# Patient Record
Sex: Male | Born: 2000 | Hispanic: Yes | Marital: Single | Smoking: Never smoker
Health system: Southern US, Community
[De-identification: ages and names within clinical notes are randomized; demographics above are authoritative.]

## PROBLEM LIST (undated history)

## (undated) DIAGNOSIS — K37 Unspecified appendicitis: Secondary | ICD-10-CM

## (undated) HISTORY — DX: Unspecified appendicitis: K37

---
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
## 2021-05-12 ENCOUNTER — Other Ambulatory Visit: Payer: Self-pay

## 2021-05-12 MED ORDER — TRIAMCINOLONE ACETONIDE 0.1 % EX OINT: TOPICAL_OINTMENT | CUTANEOUS | 0 refills | Status: DC

## 2021-06-03 ENCOUNTER — Other Ambulatory Visit: Payer: Self-pay

## 2021-06-03 ENCOUNTER — Ambulatory Visit (INDEPENDENT_AMBULATORY_CARE_PROVIDER_SITE_OTHER): Payer: Medicaid Other | Admitting: Dermatology

## 2021-06-03 DIAGNOSIS — L309 Dermatitis, unspecified: Secondary | ICD-10-CM

## 2021-06-03 MED ORDER — OPZELURA 1.5 % EX CREA: 1.0000 "application " | TOPICAL_CREAM | Freq: Two times a day (BID) | CUTANEOUS | 2 refills | Status: AC

## 2021-06-03 MED ORDER — CLOBETASOL PROPIONATE 0.05 % EX OINT: TOPICAL_OINTMENT | CUTANEOUS | 1 refills | Status: AC

## 2021-06-03 NOTE — Patient Instructions (Addendum)
Hand Dermatitis is a chronic type of eczema that can come and go on the hands and fingers.  While there is no cure, the rash and symptoms can be managed with topical prescription medications, and for more severe cases, with systemic medications.  Recommend mild soap and routine use of moisturizing cream after handwashing.  Minimize soap/water exposure when possible.    Start Opzelura Cream Apply to rash on hands 1-2 times a day until improved. Start Clobetasol Oinement Apply to more severe areas rash on hands 1-2 times a day until improved. Avoid face, groin, underarms.  Avoid using [employer]. Recommend Dove soap with handwashing.  Topical steroids (such as triamcinolone, fluocinolone, fluocinonide, mometasone, clobetasol, halobetasol, betamethasone, hydrocortisone) can cause thinning and lightening of the skin if they are used for too long in the same area. Your physician has selected the right strength medicine for your problem and area affected on the body. Please use your medication only as directed by your physician to prevent side effects.   If You Need Anything After Your Visit  If you have any questions or concerns for your doctor, please call our main line at [phone] and press option 4 to reach your doctor's medical assistant. If no one answers, please leave a voicemail as directed and we will return your call as soon as possible. Messages left after 4 pm will be answered the following business day.   You may also send [place] a message via MyChart. We typically respond to MyChart messages within 1-2 business days.  For prescription refills, please ask your pharmacy to contact our office. Our fax number is [phone].  If you have an urgent issue when the clinic is closed that cannot wait until the next business day, you can page your doctor at the number below.    Please note that while we do our best to be available for urgent issues outside of office hours, we are not  available 24/7.   If you have an urgent issue and are unable to reach [place], you may choose to seek medical care at your doctor's office, retail clinic, urgent care center, or emergency room.  If you have a medical emergency, please immediately call 911 or go to the emergency department.  Pager Numbers  - Dr. Gwen Pounds: [phone]  - Dr. Neale Burly: [phone]  - Dr. Roseanne Reno: [phone]  In the event of inclement weather, please call our main line at [phone] for an update on the status of any delays or closures.  Dermatology Medication Tips: Please keep the boxes that topical medications come in in order to help keep track of the instructions about where and how to use these. Pharmacies typically print the medication instructions only on the boxes and not directly on the medication tubes.   If your medication is too expensive, please contact our office at [phone] option 4 or send [place] a message through MyChart.   We are unable to tell what your co-pay for medications will be in advance as this is different depending on your insurance coverage. However, we may be able to find a substitute medication at lower cost or fill out paperwork to get insurance to cover a needed medication.   If a prior authorization is required to get your medication covered by your insurance company, please allow [place] 1-2 business days to complete this process.  Drug prices often vary depending on where the prescription is filled and some pharmacies may offer cheaper prices.  The website www.goodrx.com  contains coupons for medications through different pharmacies. The prices here do not account for what the cost may be with help from insurance (it may be cheaper with your insurance), but the website can give you the price if you did not use any insurance.  - You can print the associated coupon and take it with your prescription to the pharmacy.  - You may also stop by our office during regular business hours  and pick up a GoodRx coupon card.  - If you need your prescription sent electronically to a different pharmacy, notify our office through [facility] or by phone at [phone] option 4.     Si Usted Necesita Algo Despus de Su Visita  Tambin puede enviarnos un mensaje a travs de Clinical cytogeneticist. Por lo general respondemos a los mensajes de MyChart en el transcurso de 1 a 2 das hbiles.  Para renovar recetas, por favor pida a su farmacia que se ponga en contacto con nuestra oficina. Annie Sable de fax es Benjamin Perez [phone].  Si tiene un asunto urgente cuando la clnica est cerrada y que no puede esperar hasta el siguiente da hbil, puede llamar/localizar a su doctor(a) al nmero que aparece a continuacin.   Por favor, tenga en cuenta que aunque hacemos todo lo posible para estar disponibles para asuntos urgentes fuera del horario de [place], no estamos disponibles las 24 horas del da, los 7 [address]  [address] de la [place].   Si tiene un problema urgente y no puede comunicarse con nosotros, puede optar por buscar atencin mdica  en el consultorio de su doctor(a), en una clnica privada, en un centro de atencin urgente o en una sala de emergencias.  Si tiene Engineer, drilling, por favor llame inmediatamente al 911 o vaya a la sala de emergencias.  Nmeros de bper  - Dr. Gwen Pounds: [phone]  - Dra. Moye: [phone]  - Dra. Roseanne Reno: [phone]  En caso de inclemencias del [place], por favor llame a Lacy Duverney principal al [phone] para una actualizacin sobre el [place] de cualquier retraso o cierre.  Consejos para la medicacin en dermatologa: Por favor, guarde las cajas en las que vienen los medicamentos de uso tpico para ayudarle a seguir las instrucciones sobre dnde y cmo usarlos. Las farmacias generalmente imprimen las instrucciones del medicamento slo en las cajas y no directamente en los tubos del [place].   Si su medicamento es muy caro, por favor, pngase  en contacto con Rolm Gala llamando al [phone] y presione la opcin 4 o envenos un mensaje a travs de Clinical cytogeneticist.   No podemos decirle cul ser su copago por los medicamentos por adelantado ya que esto es diferente dependiendo de la cobertura de su seguro. Sin embargo, es posible que podamos encontrar un medicamento sustituto a Audiological scientist un formulario para que el seguro cubra el medicamento que se considera necesario.   Si se requiere una autorizacin previa para que su compaa de seguros [place] su medicamento, por favor permtanos de 1 a 2 das hbiles para completar [address]  Los precios de los medicamentos varan con frecuencia dependiendo del Environmental consultant de dnde se surte la receta y alguna farmacias pueden ofrecer precios ms baratos.  El sitio web www.goodrx.com tiene cupones para medicamentos de Health and safety inspector. Los precios aqu no tienen en cuenta lo que podra costar con la ayuda del seguro (puede ser ms barato con su seguro), pero el sitio web puede darle el precio si no utiliz Tourist information centre manager.  - Puede imprimir el  cupn correspondiente y llevarlo con su receta a la farmacia.  - Tambin puede pasar por nuestra oficina durante el horario de atencin regular y Charity fundraiser una tarjeta de cupones de GoodRx.  - Si necesita que su receta se enve electrnicamente a una farmacia diferente, informe a nuestra oficina a travs de MyChart de [place] o por telfono llamando al [phone] y presione la opcin 4.

## 2021-06-03 NOTE — Progress Notes (Signed)
° °  Follow-Up Visit   Subjective  Chad Warren is a 21 y.o. male who presents for the following: Dermatitis (Hands, off and on for years. Patient is currently using TMC 0.1% ointment every night, but not working as well as it has in the past. Uses CeraVe Cream to moisturize. ). He uses [employer] soap to wash hands.    The following portions of the chart were reviewed this encounter and updated as appropriate:       Review of Systems:  No other skin or systemic complaints except as noted in HPI or Assessment and Plan.  Objective  Well appearing patient in no apparent distress; mood and affect are within normal limits.  A focused examination was performed including hands. Relevant physical exam findings are noted in the Assessment and Plan.  hands hyperpigmented scaly patches of the hands with erythema.     Assessment & Plan  Hand dermatitis hands  With flare  Hand Dermatitis is a chronic type of eczema that can come and go on the hands and fingers.  While there is no cure, the rash and symptoms can be managed with topical prescription medications, and for more severe cases, with systemic medications.  Recommend mild soap and routine use of moisturizing cream after handwashing.  Minimize soap/water exposure when possible.    Start Opzelura Cream Apply to hands qd/bid prn rash dsp 60g 2Rf If not covered, will switch to [place] or tacrolimus ointment.  Start Clobetasol Ointment Apply to more severe areas of hands qd/bid dsp 30g 1Rf. Avoid face, groin, axilla. Caution atrophy with long-term use.   Avoid [employer] Works soap and use Futures trader for handwashing. And CeraVe cream as moisturizer after handwashing  Ruxolitinib Phosphate (OPZELURA) 1.5 % CREA - hands Apply 1 application topically in the morning and at bedtime. Apply to hands as needed for rash.  clobetasol ointment (TEMOVATE) 0.05 % - hands Apply to more severe areas rash on hands 1-2 times a day until improved.  Avoid face, groin, axilla.   Return if symptoms worsen or fail to improve.  ICherlyn Labella, CMA, am acting as scribe for Willeen Niece, MD .  Documentation: I have reviewed the above documentation for accuracy and completeness, and I agree with the above.  Willeen Niece MD

## 2022-08-24 ENCOUNTER — Ambulatory Visit: Payer: Medicaid Other | Attending: Cardiology | Admitting: Cardiology

## 2022-08-24 ENCOUNTER — Encounter: Payer: Self-pay | Admitting: Cardiology

## 2022-08-27 ENCOUNTER — Encounter: Payer: Self-pay | Admitting: *Deleted

## 2022-11-15 ENCOUNTER — Encounter: Payer: Self-pay | Admitting: Emergency Medicine

## 2022-11-15 ENCOUNTER — Other Ambulatory Visit: Payer: Self-pay

## 2022-11-15 DIAGNOSIS — H18822 Corneal disorder due to contact lens, left eye: Secondary | ICD-10-CM | POA: Diagnosis not present

## 2022-11-15 DIAGNOSIS — H5789 Other specified disorders of eye and adnexa: Secondary | ICD-10-CM | POA: Diagnosis present

## 2022-11-15 NOTE — ED Notes (Signed)
Visual Acuity with glasses on   Both - 20/25 R eye- 20/25 L eye- 20/25

## 2022-11-15 NOTE — ED Triage Notes (Signed)
Pt presents ambulatory to triage via POV with complaints of L eye pain started today. Pt notes having some redness earlier to his eye - no injury. The pain was made worse when removing his contacts; wearing glasses at this time. A&Ox4 at this time. Denies dizziness, CP or SOB.

## 2022-11-16 ENCOUNTER — Emergency Department
Admission: EM | Admit: 2022-11-16 | Discharge: 2022-11-16 | Disposition: A | Discharge status: Home / Self Care | Payer: Medicaid Other | Attending: Emergency Medicine | Admitting: Emergency Medicine

## 2022-11-16 DIAGNOSIS — H18822 Corneal disorder due to contact lens, left eye: Secondary | ICD-10-CM

## 2022-11-16 MED ORDER — TOBRAMYCIN 0.3 % OP OINT
1.0000 | TOPICAL_OINTMENT | Freq: Once | OPHTHALMIC | Status: DC
  Filled 2022-11-16: qty 3.5

## 2022-11-16 MED ORDER — TOBRAMYCIN 0.3 % OP SOLN
1.0000 [drp] | Freq: Once | OPHTHALMIC | Status: AC
  Administered 2022-11-16: 1 [drp] via OPHTHALMIC
  Filled 2022-11-16: qty 5

## 2022-11-16 MED ORDER — FLUORESCEIN SODIUM 1 MG OP STRP
1.0000 | ORAL_STRIP | Freq: Once | OPHTHALMIC | Status: AC
  Administered 2022-11-16: 1 via OPHTHALMIC
  Filled 2022-11-16: qty 1

## 2022-11-16 MED ORDER — TOBRAMYCIN 0.3 % OP OINT: TOPICAL_OINTMENT | Freq: Four times a day (QID) | OPHTHALMIC | 0 refills | Status: AC

## 2022-11-16 MED ORDER — TETRACAINE HCL 0.5 % OP SOLN
1.0000 [drp] | Freq: Once | OPHTHALMIC | Status: AC
  Administered 2022-11-16: 1 [drp] via OPHTHALMIC
  Filled 2022-11-16: qty 4

## 2022-11-16 NOTE — Discharge Instructions (Addendum)
Use the tobramycin antibiotic ointment on your left eye, every 6 hours, for at least the next 4 or 5 days  Do not wear your contact lenses while using this ointment and while this is healing  See your eye doctor later this week, I have also attached the information for one of our ophthalmologist that can see you in the clinic if needed this week

## 2022-11-16 NOTE — ED Provider Notes (Signed)
[facility] Provider Note    Event Date/Time   First MD Initiated Contact with Patient 11/16/22 [phone]     (approximate)   History   Eye Pain   HPI  Chad Warren is a 22 y.o. male who presents to the ED for evaluation of Eye Pain   Patient presents to the ED with left eye irritation, redness and foreign body sensation.  Patient wears contact lenses and reports he developed pain a couple days ago after he tried to take out his contact lenses.  He was able to take it out and has not worn any contact lenses to this eye since, but reports persistent redness, photophobia and foreign body sensation.  Reports his visual acuity is typical for when he is not wearing his corrective lenses.  No fevers or discharge   Physical Exam   Triage Vital Signs: ED Triage Vitals [11/15/22 2311]  Enc Vitals Group     BP 125/84     Pulse Rate 90     Resp 18     Temp 98.4 F (36.9 C)     Temp src      SpO2 100 %     Weight 210 lb (95.3 kg)     Height 5\' 8"  (1.727 m)     Head Circumference      Peak Flow      Pain Score 6     Pain Loc      Pain Edu?      Excl. in GC?     Most recent vital signs: Vitals:   11/15/22 2311  BP: 125/84  Pulse: 90  Resp: 18  Temp: 98.4 F (36.9 C)  SpO2: 100%    General: Awake, no distress.  CV:  Good peripheral perfusion.  Resp:  Normal effort.  Abd:  No distention.  MSK:  No deformity noted.  Neuro:  No focal deficits appreciated. Other:  Mild conjunctival erythema on the left side, no proptosis.  Pupils are PERRL fluorescein stain with various areas of uptake without Seidel sign, consistent with corneal abrasion   ED Results / Procedures / Treatments   Labs (all labs ordered are listed, but only abnormal results are displayed) Labs Reviewed - No data to display  EKG   RADIOLOGY   Official radiology report(s): No results found.  PROCEDURES and INTERVENTIONS:  Procedures  Medications  tobramycin (TOBREX) 0.3 %  ophthalmic ointment 1 Application (has no administration in time range)  fluorescein ophthalmic strip 1 strip (1 strip Left Eye Given by Other 11/16/22 0130)  tetracaine (PONTOCAINE) 0.5 % ophthalmic solution 1 drop (1 drop Left Eye Given by Other 11/16/22 0130)     IMPRESSION / MDM / ASSESSMENT AND PLAN / ED COURSE  I reviewed the triage vital signs and the nursing notes.  Differential diagnosis includes, but is not limited to, corneal abrasion, ulceration, retained contact or other foreign body  Patient presents with evidence of corneal abrasion associate with contact lenses.  We will get him started on appropriate antibiotic drops and have him follow-up with ophthalmology.  Discussed return precautions.     FINAL CLINICAL IMPRESSION(S) / ED DIAGNOSES   Final diagnoses:  Corneal abrasion of left eye due to contact lens     Rx / DC Orders   ED Discharge Orders          Ordered    tobramycin (TOBREX) 0.3 % ophthalmic ointment  Every 6 hours  11/16/22 0132             Note:  This document was prepared using Dragon voice recognition software and may include unintentional dictation errors.   Delton Pra[place], MD 11/16/22 [phone]

## 2023-01-19 ENCOUNTER — Ambulatory Visit: Payer: Medicaid Other

## 2023-02-21 ENCOUNTER — Ambulatory Visit: Payer: Medicaid Other

## 2023-02-21 DIAGNOSIS — Z23 Encounter for immunization: Secondary | ICD-10-CM

## 2023-02-21 DIAGNOSIS — Z719 Counseling, unspecified: Secondary | ICD-10-CM

## 2023-02-21 NOTE — Progress Notes (Signed)
In nurse clinic for immunization today Tdap. Declines Flu/Covid today. VIS provided. Tolerated vaccines well. Copy given of [facility].

## 2023-02-22 ENCOUNTER — Encounter: Payer: Self-pay | Admitting: Dermatology

## 2023-02-22 ENCOUNTER — Ambulatory Visit: Payer: Medicaid Other | Admitting: Dermatology

## 2023-02-22 DIAGNOSIS — L700 Acne vulgaris: Secondary | ICD-10-CM | POA: Diagnosis not present

## 2023-02-22 DIAGNOSIS — L810 Postinflammatory hyperpigmentation: Secondary | ICD-10-CM | POA: Diagnosis not present

## 2023-02-22 DIAGNOSIS — L309 Dermatitis, unspecified: Secondary | ICD-10-CM | POA: Diagnosis not present

## 2023-02-22 MED ORDER — DOXYCYCLINE HYCLATE 100 MG PO TABS: ORAL_TABLET | ORAL | 2 refills | Status: DC

## 2023-02-22 MED ORDER — TRETINOIN 0.025 % EX CREA
TOPICAL_CREAM | CUTANEOUS | 6 refills | Status: DC
Start: 2023-02-22 — End: 2023-06-06

## 2023-02-22 MED ORDER — TRIAMCINOLONE ACETONIDE 0.1 % EX OINT: TOPICAL_OINTMENT | CUTANEOUS | 11 refills | Status: AC

## 2023-02-22 NOTE — Patient Instructions (Addendum)
Recommend chlorhexidine (hibiclens) wash for back daily in shower . Do not get chlorhexidine (hibiclens) wash in the eyes or ears. Women should not get the wash in the vaginal area.    Recommend OTC benzoyl peroxide cleanser, wash affected areas daily in shower, let sit several minutes prior to rinsing.  May bleach towels if not rinsed off completely.  Recommended brands include Panoxyl 4% Creamy Wash, CeraVe Acne Foaming Cream wash, or Cetaphil Gentle Clear Complexion-Clearing BPO Acne Cleanser.     Due to recent changes in healthcare laws, you may see results of your pathology and/or laboratory studies on MyChart before the doctors have had a chance to review them. We understand that in some cases there may be results that are confusing or concerning to you. Please understand that not all results are received at the same time and often the doctors may need to interpret multiple results in order to provide you with the best plan of care or course of treatment. Therefore, we ask that you please give [place] 2 business days to thoroughly review all your results before contacting the office for clarification. Should we see a critical lab result, you will be contacted sooner.   If You Need Anything After Your Visit  If you have any questions or concerns for your doctor, please call our main line at [phone] and press option 4 to reach your doctor's medical assistant. If no one answers, please leave a voicemail as directed and we will return your call as soon as possible. Messages left after 4 pm will be answered the following business day.   You may also send [place] a message via MyChart. We typically respond to MyChart messages within 1-2 business days.  For prescription refills, please ask your pharmacy to contact our office. Our fax number is [phone].  If you have an urgent issue when the clinic is closed that cannot wait until the next business day, you can page your doctor at the number below.     Please note that while we do our best to be available for urgent issues outside of office hours, we are not available 24/7.   If you have an urgent issue and are unable to reach [place], you may choose to seek medical care at your doctor's office, retail clinic, urgent care center, or emergency room.  If you have a medical emergency, please immediately call 911 or go to the emergency department.  Pager Numbers  - Dr. Gwen Pounds: [phone]  - Dr. Roseanne Reno: [phone]  - Dr. Katrinka Blazing: [phone]   In the event of inclement weather, please call our main line at [phone] for an update on the status of any delays or closures.  Dermatology Medication Tips: Please keep the boxes that topical medications come in in order to help keep track of the instructions about where and how to use these. Pharmacies typically print the medication instructions only on the boxes and not directly on the medication tubes.   If your medication is too expensive, please contact our office at [phone] option 4 or send [place] a message through MyChart.   We are unable to tell what your co-pay for medications will be in advance as this is different depending on your insurance coverage. However, we may be able to find a substitute medication at lower cost or fill out paperwork to get insurance to cover a needed medication.   If a prior authorization is required to get your medication covered by your insurance company, please allow [place]  1-2 business days to complete this process.  Drug prices often vary depending on where the prescription is filled and some pharmacies may offer cheaper prices.  The website www.goodrx.com contains coupons for medications through different pharmacies. The prices here do not account for what the cost may be with help from insurance (it may be cheaper with your insurance), but the website can give you the price if you did not use any insurance.  - You can print the associated coupon and  take it with your prescription to the pharmacy.  - You may also stop by our office during regular business hours and pick up a GoodRx coupon card.  - If you need your prescription sent electronically to a different pharmacy, notify our office through [facility] or by phone at [phone] option 4.     Si Usted Necesita Algo Despus de Su Visita  Tambin puede enviarnos un mensaje a travs de Clinical cytogeneticist. Por lo general respondemos a los mensajes de MyChart en el transcurso de 1 a 2 das hbiles.  Para renovar recetas, por favor pida a su farmacia que se ponga en contacto con nuestra oficina. Annie Sable de fax es [place] [phone].  Si tiene un asunto urgente cuando la clnica est cerrada y que no puede esperar hasta el siguiente da hbil, puede llamar/localizar a su doctor(a) al nmero que aparece a continuacin.   Por favor, tenga en cuenta que aunque hacemos todo lo posible para estar disponibles para asuntos urgentes fuera del horario de [place], no estamos disponibles las 24 horas del da, los 7 [address]  [address] de la [place].   Si tiene un problema urgente y no puede comunicarse con nosotros, puede optar por buscar atencin mdica  en el consultorio de su doctor(a), en una clnica privada, en un centro de atencin urgente o en una sala de emergencias.  Si tiene Engineer, drilling, por favor llame inmediatamente al 911 o vaya a la sala de emergencias.  Nmeros de bper  - Dr. Gwen Pounds: [phone]  - Dra. Roseanne Reno: [phone]  - Dr. Katrinka Blazing: [phone]   En caso de inclemencias del tiempo, por favor llame a Lacy Duverney principal al [phone] para una actualizacin sobre el [place] de cualquier retraso o cierre.  Consejos para la medicacin en dermatologa: Por favor, guarde las cajas en las que vienen los medicamentos de uso tpico para ayudarle a seguir las instrucciones sobre dnde y cmo usarlos. Las farmacias generalmente imprimen las instrucciones del medicamento slo  en las cajas y no directamente en los tubos del [place].   Si su medicamento es muy caro, por favor, pngase en contacto con Rolm Gala llamando al [phone] y presione la opcin 4 o envenos un mensaje a travs de Clinical cytogeneticist.   No podemos decirle cul ser su copago por los medicamentos por adelantado ya que esto es diferente dependiendo de la cobertura de su seguro. Sin embargo, es posible que podamos encontrar un medicamento sustituto a Audiological scientist un formulario para que el seguro cubra el medicamento que se considera necesario.   Si se requiere una autorizacin previa para que su compaa de seguros [place] su medicamento, por favor permtanos de 1 a 2 das hbiles para completar [address]  Los precios de los medicamentos varan con frecuencia dependiendo del Environmental consultant de dnde se surte la receta y alguna farmacias pueden ofrecer precios ms baratos.  El sitio web www.goodrx.com tiene cupones para medicamentos de Health and safety inspector. Los precios aqu no tienen en cuenta lo que podra  costar con la ayuda del seguro (puede ser ms barato con su seguro), pero el sitio web puede darle el precio si no Visual merchandiser.  - Puede imprimir el cupn correspondiente y llevarlo con su receta a la farmacia.  - Tambin puede pasar por nuestra oficina durante el horario de atencin regular y Education officer, museum una tarjeta de cupones de GoodRx.  - Si necesita que su receta se enve electrnicamente a una farmacia diferente, informe a nuestra oficina a travs de MyChart de [place] o por telfono llamando al [phone] y presione la opcin 4.

## 2023-02-22 NOTE — Progress Notes (Signed)
Follow-Up Visit   Subjective  Chad Warren is a 22 y.o. male who presents for the following: Patient c/o dark marks on his back for ~ 1 year now, he think it could related to history of acne on his back, dark marks are getting worse Refill Triamcinolone ointment he uses on his hands for eczema with a good response.   The patient has spots, moles and lesions to be evaluated, some may be new or changing and the patient may have concern these could be cancer.     The following portions of the chart were reviewed this encounter and updated as appropriate: medications, allergies, medical history  Review of Systems:  No other skin or systemic complaints except as noted in HPI or Assessment and Plan.  Objective  Well appearing patient in no apparent distress; mood and affect are within normal limits.  A focused examination was performed of the following areas:face,back,hands   Relevant physical exam findings are noted in the Assessment and Plan.           Assessment & Plan   Acne vulgaris  Related Medications doxycycline (VIBRA-TABS) 100 MG tablet Take 1 tablet twice a day with food and a full glass of water  tretinoin (RETIN-A) 0.025 % cream Apply to face at bedtime, to begin apply to face every other night increase a night each week as tolerated  Postinflammatory hyperpigmentation  Hand dermatitis  Related Medications Ruxolitinib Phosphate (OPZELURA) 1.5 % CREA Apply 1 application topically in the morning and at bedtime. Apply to hands as needed for rash.  clobetasol ointment (TEMOVATE) 0.05 % Apply to more severe areas rash on hands 1-2 times a day until improved. Avoid face, groin, axilla.  triamcinolone ointment (KENALOG) 0.1 % Apply to hands 1-2 times a day as needed for rash. Avoid face, groin, axilla.  POST INFLAMMATORY HYPERPIGMENTATION, chronic, flaring, not at patient goal Back: scattered hyperpigmented macules from prior acne lesions  Plan: Will  clear with time but acne must be controlled or new lesions will form  ACNE VULGARIS Face: scattered comedones, inflamed papules and pustules on upper cutaneous lip, forehead, cheeks, chin, temples Back: scattered inflamed follicular papules  Chronic and persistent condition with duration or expected duration over one year. Condition is symptomatic/ bothersome to patient. Not currently at goal.   Start Doxycycline 100 mg take 1 tablet bid with food and a full glass of water  Doxycycline should be taken with food to prevent nausea. Do not lay down for 30 minutes after taking. Be cautious with sun exposure and use good sun protection while on this medication. Pregnant women should not take this medication.    For face: Start Tretinoin .025% cream every other night x 2 weeks then nightly. Follow with moisturizer if needed For back: Recommend chlorhexidine (hibiclens) wash in the shower. Do not get chlorhexidine (hibiclens) wash in the eyes or ears. Or  OTC benzoyl peroxide cleanser, wash affected areas daily in shower, let sit several minutes prior to rinsing.  May bleach towels if not rinsed off completely.  Recommended brands include Panoxyl 4% Creamy Wash, CeraVe Acne Foaming Cream wash, or Cetaphil Gentle Clear Complexion-Clearing BPO Acne Cleanser.   Hand dermatitis, chronic, well-controlled, at patient goal Hands: clear   Hand Dermatitis is a chronic type of eczema that can come and go on the hands and fingers.  While there is no cure, the rash and symptoms can be managed with topical prescription medications, and for more severe cases, with systemic medications.  Recommend mild soap and routine use of moisturizing cream after handwashing.  Minimize soap/water exposure when possible.   Continue Triamcinolone 0.1% ointment apply to hands qd-bid prn  Topical steroids (such as triamcinolone, fluocinolone, fluocinonide, mometasone, clobetasol, halobetasol, betamethasone, hydrocortisone) can cause  thinning and lightening of the skin if they are used for too long in the same area. Your physician has selected the right strength medicine for your problem and area affected on the body. Please use your medication only as directed by your physician to prevent side effects.      Return in about 3 months (around 05/24/2023) for Acne, Eczema .  IAngelique Holm, CMA, am acting as scribe for Elie Goody, MD .   Documentation: I have reviewed the above documentation for accuracy and completeness, and I agree with the above.  Elie Goody, MD

## 2023-06-02 ENCOUNTER — Other Ambulatory Visit (LOCAL_COMMUNITY_HEALTH_CENTER): Payer: Self-pay

## 2023-06-02 DIAGNOSIS — Z111 Encounter for screening for respiratory tuberculosis: Secondary | ICD-10-CM

## 2023-06-02 NOTE — Progress Notes (Signed)
 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 ROI signed. RN walked pt to lab. Patient states he will need paper result copy and RN explained that he will receive call from [facility] when results are ready. MyChart link sent to patient's email per his request. Amadeo Pines, RN

## 2023-06-05 LAB — QUANTIFERON-TB GOLD PLUS
QuantiFERON Mitogen Value: >10 [IU]/mL
QuantiFERON Nil Value: 0.02 [IU]/mL
QuantiFERON TB1 Ag Value: 0.05 [IU]/mL
QuantiFERON TB2 Ag Value: 0.07 [IU]/mL
QuantiFERON-TB Gold Plus: NEGATIVE

## 2023-06-06 ENCOUNTER — Encounter: Payer: Self-pay | Admitting: Dermatology

## 2023-06-06 ENCOUNTER — Ambulatory Visit: Payer: Medicaid Other | Admitting: Dermatology

## 2023-06-06 DIAGNOSIS — L309 Dermatitis, unspecified: Secondary | ICD-10-CM

## 2023-06-06 DIAGNOSIS — L700 Acne vulgaris: Secondary | ICD-10-CM

## 2023-06-06 MED ORDER — DOXYCYCLINE HYCLATE 100 MG PO TABS: ORAL_TABLET | ORAL | 2 refills | Status: DC

## 2023-06-06 MED ORDER — TRETINOIN 0.025 % EX CREA: TOPICAL_CREAM | CUTANEOUS | 6 refills | Status: AC

## 2023-06-06 NOTE — Progress Notes (Signed)
   Follow-Up Visit   Subjective  Chad Warren is a 23 y.o. male who presents for the following: Acne Vulgaris. Face, back. Taking Doxycycline  100 mg twice daily. Using Tretinoin  0.025% cream to face as directed. Using OTC BPO cleanser. Tolerating treatment regimen well.   Eczema follow up. Hands. Uses Triamcinolone  0.1% ointment as needed.   The following portions of the chart were reviewed this encounter and updated as appropriate: medications, allergies, medical history  Review of Systems:  No other skin or systemic complaints except as noted in HPI or Assessment and Plan.  Objective  Well appearing patient in no apparent distress; mood and affect are within normal limits.  Areas Examined: Face, chest and back  Relevant exam findings are noted in the Assessment and Plan.   Assessment & Plan   ACNE VULGARIS   Related Medications tretinoin  (RETIN-A ) 0.025 % cream Apply to face at bedtime, to begin apply to face every other night increase a night each week as tolerated doxycycline  (VIBRA -TABS) 100 MG tablet Take 1 tablet twice a day with food and a full glass of water ACNE VULGARIS Exam: inflammatory papules/pustules on left upper cutaneous lip near ala, otherwise clear  Chronic and persistent condition with duration or expected duration over one year. Condition is improving with treatment but not currently at goal.   Treatment Plan: No inhaled steroid use Continue Tretinoin  0.025% cream to face at bedtime.   Continue Doxycycline  100 mg twice daily with food. If acne recurs after second round of doxycycline , would recommend isotretinoin. Caution with photosensitivity in Spring/Summer  Topical retinoid medications like tretinoin /Retin-A , adapalene/Differin, tazarotene/Fabior, and Epiduo/Epiduo Forte can cause dryness and irritation when first started. Only apply a pea-sized amount to the entire affected area. Avoid applying it around the eyes, edges of mouth and creases at the  nose. If you experience irritation, use a good moisturizer first and/or apply the medicine less often. If you are doing well with the medicine, you can increase how often you use it until you are applying every night. Be careful with sun protection while using this medication as it can make you sensitive to the sun. This medicine should not be used by pregnant women.   Doxycycline  should be taken with food to prevent nausea. Do not lay down for 30 minutes after taking. Be cautious with sun exposure and use good sun protection while on this medication. Pregnant women should not take this medication.     Hand dermatitis, chronic, well-controlled, at patient goal  No refills needed.   Return in about 5 months (around 11/04/2023) for Acne Follow Up.    Documentation: I have reviewed the above documentation for accuracy and completeness, and I agree with the above.  Boneta Sharps, MD

## 2023-06-06 NOTE — Progress Notes (Signed)
 Reviewed. QFT results negative. Phone call to patient to inform him of results. Patient states he will pick up result copy at [facility] info booth. RN informed him that he may pick up results Mon through Friday between 8 am and 5 pm. Copy placed at info booth. Bergen Magner, RN

## 2023-06-06 NOTE — Patient Instructions (Addendum)
 Continue Tretinoin  0.025% cream to face at bedtime.   Continue Doxycycline  100 mg twice daily with food  Topical retinoid medications like tretinoin /Retin-A , adapalene/Differin, tazarotene/Fabior, and Epiduo/Epiduo Forte can cause dryness and irritation when first started. Only apply a pea-sized amount to the entire affected area. Avoid applying it around the eyes, edges of mouth and creases at the nose. If you experience irritation, use a good moisturizer first and/or apply the medicine less often. If you are doing well with the medicine, you can increase how often you use it until you are applying every night. Be careful with sun protection while using this medication as it can make you sensitive to the sun. This medicine should not be used by pregnant women.   Doxycycline  should be taken with food to prevent nausea. Do not lay down for 30 minutes after taking. Be cautious with sun exposure and use good sun protection while on this medication. Pregnant women should not take this medication.     Continue Triamcinolone  0.1% ointment apply to hands once or twice daily as needed.   Topical steroids (such as triamcinolone , fluocinolone, fluocinonide, mometasone, clobetasol , halobetasol, betamethasone, hydrocortisone) can cause thinning and lightening of the skin if they are used for too long in the same area. Your physician has selected the right strength medicine for your problem and area affected on the body. Please use your medication only as directed by your physician to prevent side effects.       Due to recent changes in healthcare laws, you may see results of your pathology and/or laboratory studies on MyChart before the doctors have had a chance to review them. We understand that in some cases there may be results that are confusing or concerning to you. Please understand that not all results are received at the same time and often the doctors may need to interpret multiple results in order to  provide you with the best plan of care or course of treatment. Therefore, we ask that you please give us  2 business days to thoroughly review all your results before contacting the office for clarification. Should we see a critical lab result, you will be contacted sooner.   If You Need Anything After Your Visit  If you have any questions or concerns for your doctor, please call our main line at [phone] and press option 4 to reach your doctor's medical assistant. If no one answers, please leave a voicemail as directed and we will return your call as soon as possible. Messages left after 4 pm will be answered the following business day.   You may also send us  a message via MyChart. We typically respond to MyChart messages within 1-2 business days.  For prescription refills, please ask your pharmacy to contact our office. Our fax number is [phone].  If you have an urgent issue when the clinic is closed that cannot wait until the next business day, you can page your doctor at the number below.    Please note that while we do our best to be available for urgent issues outside of office hours, we are not available 24/7.   If you have an urgent issue and are unable to reach us , you may choose to seek medical care at your doctor's office, retail clinic, urgent care center, or emergency room.  If you have a medical emergency, please immediately call 911 or go to the emergency department.  Pager Numbers  - Dr. Hester: [phone]  - Dr. Jackquline: [phone]  - Dr.  Claudene: [phone]   In the event of inclement weather, please call our main line at [phone] for an update on the status of any delays or closures.  Dermatology Medication Tips: Please keep the boxes that topical medications come in in order to help keep track of the instructions about where and how to use these. Pharmacies typically print the medication instructions only on the boxes and not directly on the  medication tubes.   If your medication is too expensive, please contact our office at [phone] option 4 or send us  a message through MyChart.   We are unable to tell what your co-pay for medications will be in advance as this is different depending on your insurance coverage. However, we may be able to find a substitute medication at lower cost or fill out paperwork to get insurance to cover a needed medication.   If a prior authorization is required to get your medication covered by your insurance company, please allow us  1-2 business days to complete this process.  Drug prices often vary depending on where the prescription is filled and some pharmacies may offer cheaper prices.  The website www.goodrx.com contains coupons for medications through different pharmacies. The prices here do not account for what the cost may be with help from insurance (it may be cheaper with your insurance), but the website can give you the price if you did not use any insurance.  - You can print the associated coupon and take it with your prescription to the pharmacy.  - You may also stop by our office during regular business hours and pick up a GoodRx coupon card.  - If you need your prescription sent electronically to a different pharmacy, notify our office through [facility] or by phone at [phone] option 4.     Si Usted Necesita Algo Despus de Su Visita  Tambin puede enviarnos un mensaje a travs de Clinical Cytogeneticist. Por lo general respondemos a los mensajes de MyChart en el transcurso de 1 a 2 das hbiles.  Para renovar recetas, por favor pida a su farmacia que se ponga en contacto con nuestra oficina. Randi lakes de fax es [place] [phone].  Si tiene un asunto urgente cuando la clnica est cerrada y que no puede esperar hasta el siguiente da hbil, puede llamar/localizar a su doctor(a) al nmero que aparece a continuacin.   Por favor, tenga en cuenta que aunque hacemos todo lo posible  para estar disponibles para asuntos urgentes fuera del horario de [place], no estamos disponibles las 24 horas del da, los 7 [address]  [address] de la [place].   Si tiene un problema urgente y no puede comunicarse con nosotros, puede optar por buscar atencin mdica  en el consultorio de su doctor(a), en una clnica privada, en un centro de atencin urgente o en una sala de emergencias.  Si tiene engineer, drilling, por favor llame inmediatamente al 911 o vaya a la sala de emergencias.  Nmeros de bper  - Dr. Hester: [phone]  - Dra. Jackquline: [phone]  - Dr. Claudene: [phone]   En caso de inclemencias del tiempo, por favor llame a landry capes principal al [phone] para una actualizacin sobre el [place] de cualquier retraso o cierre.  Consejos para la medicacin en dermatologa: Por favor, guarde las cajas en las que vienen los medicamentos de uso tpico para ayudarle a seguir las instrucciones sobre dnde y cmo usarlos. Las farmacias generalmente imprimen las instrucciones del medicamento slo en las cajas y no directamente en los  tubos del medicamento.   Si su medicamento es muy caro, por favor, pngase en contacto con landry rieger llamando al [phone] y presione la opcin 4 o envenos un mensaje a travs de Clinical Cytogeneticist.   No podemos decirle cul ser su copago por los medicamentos por adelantado ya que esto es diferente dependiendo de la cobertura de su seguro. Sin embargo, es posible que podamos encontrar un medicamento sustituto a audiological scientist un formulario para que el seguro cubra el medicamento que se considera necesario.   Si se requiere una autorizacin previa para que su compaa de seguros cubra su medicamento, por favor permtanos de 1 a 2 das hbiles para completar este proceso.  Los precios de los medicamentos varan con frecuencia dependiendo del environmental consultant de dnde se surte la receta y alguna farmacias pueden ofrecer precios ms baratos.  El sitio web  www.goodrx.com tiene cupones para medicamentos de health and safety inspector. Los precios aqu no tienen en cuenta lo que podra costar con la ayuda del seguro (puede ser ms barato con su seguro), pero el sitio web puede darle el precio si no utiliz tourist information centre manager.  - Puede imprimir el cupn correspondiente y llevarlo con su receta a la farmacia.  - Tambin puede pasar por nuestra oficina durante el horario de atencin regular y education officer, museum una tarjeta de cupones de GoodRx.  - Si necesita que su receta se enve electrnicamente a una farmacia diferente, informe a nuestra oficina a travs de MyChart de [place] o por telfono llamando al [phone] y presione la opcin 4.

## 2023-11-07 ENCOUNTER — Ambulatory Visit (INDEPENDENT_AMBULATORY_CARE_PROVIDER_SITE_OTHER): Payer: Medicaid Other | Admitting: Dermatology

## 2023-11-07 ENCOUNTER — Encounter: Payer: Self-pay | Admitting: Dermatology

## 2023-11-07 DIAGNOSIS — L700 Acne vulgaris: Secondary | ICD-10-CM

## 2023-11-07 DIAGNOSIS — Z7189 Other specified counseling: Secondary | ICD-10-CM

## 2023-11-07 NOTE — Patient Instructions (Addendum)
 Isotretinoin Counseling; Review and Contraception Counseling: Reviewed potential side effects of isotretinoin including xerosis, cheilitis, hepatitis, hyperlipidemia, and severe birth defects if taken by a pregnant woman.  Women on isotretinoin must be celibate (not having sex) or required to use at least 2 birth control methods to prevent pregnancy (unless patient is a male of non-child bearing potential).  Females of child-bearing potential must have monthly pregnancy tests while on isotretinoin and report through I-Pledge (FDA monitoring program). Reviewed reports of suicidal ideation in those with a history of depression while taking isotretinoin and reports of diagnosis of inflammatory bowl disease (IBD) while taking isotretinoin as well as the lack of evidence for a causal relationship between isotretinoin, depression and IBD. Patient advised to reach out with any questions or concerns. Patient advised not to share pills or donate blood while on treatment or for one month after completing treatment. All patient's considering Isotretinoin must read and understand and sign Isotretinoin Consent Form and be registered with I-Pledge.   Acne is Severe; chronic and persistent; not at goal. Patient is on Isotretinoin -  requiring FDA mandated monthly evaluations and laboratory monitoring.  While taking isotretinoin, do not share pills and do not donate blood. Generic isotretinoin is best absorbed when taken with a fatty meal. Isotretinoin can make you sensitive to the sun. Daily careful sun protection including sunscreen SPF 30+ when outdoors is recommended.   Due to recent changes in healthcare laws, you may see results of your pathology and/or laboratory studies on MyChart before the doctors have had a chance to review them. We understand that in some cases there may be results that are confusing or concerning to you. Please understand that not all results are received at the same time and often the  doctors may need to interpret multiple results in order to provide you with the best plan of care or course of treatment. Therefore, we ask that you please give us  2 business days to thoroughly review all your results before contacting the office for clarification. Should we see a critical lab result, you will be contacted sooner.   If You Need Anything After Your Visit  If you have any questions or concerns for your doctor, please call our main line at [phone] and press option 4 to reach your doctor's medical assistant. If no one answers, please leave a voicemail as directed and we will return your call as soon as possible. Messages left after 4 pm will be answered the following business day.   You may also send us  a message via MyChart. We typically respond to MyChart messages within 1-2 business days.  For prescription refills, please ask your pharmacy to contact our office. Our fax number is [phone].  If you have an urgent issue when the clinic is closed that cannot wait until the next business day, you can page your doctor at the number below.    Please note that while we do our best to be available for urgent issues outside of office hours, we are not available 24/7.   If you have an urgent issue and are unable to reach us , you may choose to seek medical care at your doctor's office, retail clinic, urgent care center, or emergency room.  If you have a medical emergency, please immediately call 911 or go to the emergency department.  Pager Numbers  - Dr. Bary Likes: [phone]  - Dr. Annette Barters: [phone]  - Dr. Felipe Horton: [phone]   In the event of inclement weather, please call our  main line at [phone] for an update on the status of any delays or closures.  Dermatology Medication Tips: Please keep the boxes that topical medications come in in order to help keep track of the instructions about where and how to use these. Pharmacies typically print the medication  instructions only on the boxes and not directly on the medication tubes.   If your medication is too expensive, please contact our office at [phone] option 4 or send us  a message through MyChart.   We are unable to tell what your co-pay for medications will be in advance as this is different depending on your insurance coverage. However, we may be able to find a substitute medication at lower cost or fill out paperwork to get insurance to cover a needed medication.   If a prior authorization is required to get your medication covered by your insurance company, please allow us  1-2 business days to complete this process.  Drug prices often vary depending on where the prescription is filled and some pharmacies may offer cheaper prices.  The website www.goodrx.com contains coupons for medications through different pharmacies. The prices here do not account for what the cost may be with help from insurance (it may be cheaper with your insurance), but the website can give you the price if you did not use any insurance.  - You can print the associated coupon and take it with your prescription to the pharmacy.  - You may also stop by our office during regular business hours and pick up a GoodRx coupon card.  - If you need your prescription sent electronically to a different pharmacy, notify our office through [facility] or by phone at [phone] option 4.     Si Usted Necesita Algo Despus de Su Visita  Tambin puede enviarnos un mensaje a travs de Clinical cytogeneticist. Por lo general respondemos a los mensajes de MyChart en el transcurso de 1 a 2 das hbiles.  Para renovar recetas, por favor pida a su farmacia que se ponga en contacto con nuestra oficina. Franz Jacks de fax es [place] [phone].  Si tiene un asunto urgente cuando la clnica est cerrada y que no puede esperar hasta el siguiente da hbil, puede llamar/localizar a su doctor(a) al nmero que aparece a continuacin.   Por  favor, tenga en cuenta que aunque hacemos todo lo posible para estar disponibles para asuntos urgentes fuera del horario de [place], no estamos disponibles las 24 horas del da, los 7 [address]  [address] de la [place].   Si tiene un problema urgente y no puede comunicarse con nosotros, puede optar por buscar atencin mdica  en el consultorio de su doctor(a), en una clnica privada, en un centro de atencin urgente o en una sala de emergencias.  Si tiene Engineer, drilling, por favor llame inmediatamente al 911 o vaya a la sala de emergencias.  Nmeros de bper  - Dr. Bary Likes: [phone]  - Dra. Annette Barters: [phone]  - Dr. Felipe Horton: [phone]   En caso de inclemencias del tiempo, por favor llame a Lajuan Pila principal al [phone] para una actualizacin sobre el [place] de cualquier retraso o cierre.  Consejos para la medicacin en dermatologa: Por favor, guarde las cajas en las que vienen los medicamentos de uso tpico para ayudarle a seguir las instrucciones sobre dnde y cmo usarlos. Las farmacias generalmente imprimen las instrucciones del medicamento slo en las cajas y no directamente en los tubos del [place].   Si su medicamento es [employer], por favor,  pngase en contacto con nuestra oficina llamando al [phone] y presione la opcin 4 o envenos un mensaje a travs de Clinical cytogeneticist.   No podemos decirle cul ser su copago por los medicamentos por adelantado ya que esto es diferente dependiendo de la cobertura de su seguro. Sin embargo, es posible que podamos encontrar un medicamento sustituto a Audiological scientist un formulario para que el seguro cubra el medicamento que se considera necesario.   Si se requiere una autorizacin previa para que su compaa de seguros [place] su medicamento, por favor permtanos de 1 a 2 das hbiles para completar este proceso.  Los precios de los medicamentos varan con frecuencia dependiendo del Environmental consultant de dnde se surte la receta y alguna farmacias  pueden ofrecer precios ms baratos.  El sitio web www.goodrx.com tiene cupones para medicamentos de Health and safety inspector. Los precios aqu no tienen en cuenta lo que podra costar con la ayuda del seguro (puede ser ms barato con su seguro), pero el sitio web puede darle el precio si no utiliz Tourist information centre manager.  - Puede imprimir el cupn correspondiente y llevarlo con su receta a la farmacia.  - Tambin puede pasar por nuestra oficina durante el horario de atencin regular y Education officer, museum una tarjeta de cupones de GoodRx.  - Si necesita que su receta se enve electrnicamente a una farmacia diferente, informe a nuestra oficina a travs de MyChart de [place] o por telfono llamando al [phone] y presione la opcin 4.

## 2023-11-07 NOTE — Progress Notes (Signed)
 Follow-Up Visit   Subjective  Chad Warren is a 23 y.o. male who presents for the following: Acne follow-up, patient taking Doxycycline  100mg  BID with no side effects and using tretinoin  nightly. Patient does report acne has improved on current regimen. Patient reports minimal flares at face still flaring at back. Patient has used panoxyl, hibiclens, and abx wash. Patient is self pay.   The patient has spots, moles and lesions to be evaluated, some may be new or changing and the patient may have concern these could be cancer.   The following portions of the chart were reviewed this encounter and updated as appropriate: medications, allergies, medical history  Review of Systems:  No other skin or systemic complaints except as noted in HPI or Assessment and Plan.  Objective  Well appearing patient in no apparent distress; mood and affect are within normal limits.  A focused examination was performed of the following areas: Face, back  Relevant exam findings are noted in the Assessment and Plan.    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 Clearance of inflammatory papules elsewhere on face. Scattered inflamed papules and pustules on upper back  Chronic and persistent condition with duration or expected duration over one year. Condition is improving with treatment but not currently at goal.   Treatment Plan: Discussed acne on back can be stubborn, discussed next step would be isotretinoin. Would have to discontinue all other acne medications. Recommend gentle cleansers when washing face, avoiding acid in ingredients.   Patient wants to think about starting isotretinoin and let us  know through MyChart if he wants to start. Advised patient if he decides to continue forth we can send prescription to [facility] without him having to return for a visit since patient is self-pay.   Patient advised that he must be off  of Doxycycline  for a month prior to starting isotretinoin.   Continue tretinoin  0.025% cream nightly to face and doxy 100  mg BID Patient tried hibiclens BPO on back  Isotretinoin Counseling; Review and Contraception Counseling: Reviewed potential side effects of isotretinoin including xerosis, cheilitis, hepatitis, myalgia arthralgia telogen effluvium, dry eye, reduced night vision, epistaxis, hyperlipidemia, and severe birth defects if taken by a pregnant woman.  Women on isotretinoin must be celibate (not having sex) or required to use at least 2 birth control methods to prevent pregnancy (unless patient is a male of non-child bearing potential).  Females of child-bearing potential must have monthly pregnancy tests while on isotretinoin and report through I-Pledge (FDA monitoring program). Reviewed reports of suicidal ideation in those with a history of depression while taking isotretinoin and reports of diagnosis of inflammatory bowl disease (IBD) while taking isotretinoin as well as the lack of evidence for a causal relationship between isotretinoin, depression and IBD. Patient advised to reach out with any questions or concerns. Patient advised not to share pills or donate blood while on treatment or for one month after completing treatment. All patient's considering Isotretinoin must read and understand and sign Isotretinoin Consent Form and be registered with I-Pledge.  Acne is Severe; chronic and persistent; not at goal. Patient is on Isotretinoin -  requiring FDA mandated monthly evaluations and laboratory monitoring.  While taking isotretinoin, do not share pills and do not donate blood. Generic isotretinoin is best absorbed when taken with a fatty meal. Isotretinoin can make you sensitive to the sun. Daily careful sun protection including sunscreen SPF 30+ when outdoors  is recommended.      Return for w/ Dr. Felipe Horton, Acne.  I, Jacquelynn V. Grier Leber, CMA, am acting as scribe for  Harris Liming, MD .   Documentation: I have reviewed the above documentation for accuracy and completeness, and I agree with the above.  Harris Liming, MD

## 2024-03-07 ENCOUNTER — Telehealth: Payer: Self-pay

## 2024-03-07 DIAGNOSIS — L700 Acne vulgaris: Secondary | ICD-10-CM

## 2024-03-07 NOTE — Telephone Encounter (Signed)
 Patient called today asking for RF of Doxycycline  to [facility]. He does not want to do Accutane therapy at this time.   Patient is also not scheduled for any follow up and last seen June 2025. Please advise.

## 2024-03-08 MED ORDER — DOXYCYCLINE HYCLATE 100 MG PO TABS: ORAL_TABLET | ORAL | 2 refills | Status: AC

## 2024-05-16 ENCOUNTER — Ambulatory Visit: Payer: Self-pay

## 2024-05-16 DIAGNOSIS — Z201 Contact with and (suspected) exposure to tuberculosis: Secondary | ICD-10-CM

## 2024-05-16 LAB — HM HIV SCREENING LAB: HM HIV Screening: NEGATIVE

## 2024-05-16 LAB — HM HEPATITIS C SCREENING LAB: HM Hepatitis Screen: NEGATIVE

## 2024-05-17 NOTE — Progress Notes (Signed)
 Patient in clinic 05/16/2024 for TB evaluation.  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  TB symptoms and risk screening completed today.    QFT, HIV/HCV done today.    If QFT negative, will need follow up QFT 8 weeks after exposure.

## 2024-05-19 LAB — QUANTIFERON-TB GOLD PLUS
QuantiFERON Mitogen Value: >10 [IU]/mL
QuantiFERON Nil Value: 0.06 [IU]/mL
QuantiFERON TB1 Ag Value: 0.05 [IU]/mL
QuantiFERON TB2 Ag Value: 0.06 [IU]/mL
QuantiFERON-TB Gold Plus: NEGATIVE

## 2024-05-22 ENCOUNTER — Encounter: Payer: Self-pay | Admitting: Dermatology

## 2024-05-22 ENCOUNTER — Ambulatory Visit (INDEPENDENT_AMBULATORY_CARE_PROVIDER_SITE_OTHER): Payer: Self-pay | Admitting: Dermatology

## 2024-05-22 DIAGNOSIS — Z79899 Other long term (current) drug therapy: Secondary | ICD-10-CM

## 2024-05-22 DIAGNOSIS — L209 Atopic dermatitis, unspecified: Secondary | ICD-10-CM

## 2024-05-22 DIAGNOSIS — Z7189 Other specified counseling: Secondary | ICD-10-CM

## 2024-05-22 MED ORDER — CLOBETASOL PROPIONATE 0.05 % EX OINT: TOPICAL_OINTMENT | CUTANEOUS | 5 refills | Status: AC

## 2024-05-22 MED ORDER — HYDROCORTISONE 2.5 % EX OINT: TOPICAL_OINTMENT | CUTANEOUS | 5 refills | Status: AC

## 2024-05-22 NOTE — Progress Notes (Signed)
" ° °  Follow-Up Visit   Subjective  Chad Warren is a 23 y.o. male who presents for the following: Eczema on hands and eye Patient states he is present for eczema of the hands medication refill, he also wants to be evaluated for possibly getting eczema on both eyes closer to nose, outer eye and under eye.  The following portions of the chart were reviewed this encounter and updated as appropriate: medications, allergies, medical history  Review of Systems:  No other skin or systemic complaints except as noted in HPI or Assessment and Plan.  Objective  Well appearing patient in no apparent distress; mood and affect are within normal limits.  A focused examination was performed of the following areas: Bilateral hands, and face  Relevant exam findings are noted in the Assessment and Plan.    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 Scaly inflamed xerotic plaques +/- fissures on hands 3 % BSA  Chronic and persistent condition with duration or expected duration over one year. Condition is bothersome/symptomatic for patient. Currently flared.   Atopic dermatitis (eczema) is a chronic, relapsing, pruritic condition that can significantly affect quality of life. It is often associated with allergic rhinitis and/or asthma and can require treatment with topical medications, phototherapy, or in severe cases biologic injectable medication (Dupixent; Adbry) or Oral JAK inhibitors.  Treatment Plan: Start hydrocortisone  2.5% cream apply topically twice daily to affected areas of the face. Risk of acne periorificial dermatitis if overused  Start clobetasol  0.05% ointment apply topically twice daily to hands.  Topical steroids (such as triamcinolone , fluocinolone, fluocinonide, mometasone, clobetasol , halobetasol, betamethasone, hydrocortisone ) can cause thinning and lightening of the skin if they are used for too long in the same area.  Your physician has selected the right strength medicine for your problem and area affected on the body. Please use your medication only as directed by your physician to prevent side effects.   Recommend mild soap and moisturizing cream with hand washing.  ATOPIC DERMATITIS, UNSPECIFIED TYPE   This Visit - hydrocortisone  2.5 % ointment - Apply 1 gram twice daily to affected areas of face. Stop once resolved and restart as needed for flares. Avoid use on face, armpits, groin unless otherwise indicated. - clobetasol  ointment (TEMOVATE ) 0.05 % - Apply 1 gram topically to affected area of the hands twice daily. Stop once resolved and restart as needed for flares. Avoid use on face, armpits, groin unless otherwise indicated. COUNSELING AND COORDINATION OF CARE   MEDICATION MANAGEMENT    Return if symptoms worsen or fail to improve.  IAlmetta Nora, RMA, am acting as scribe for Boneta Sharps, MD .   Documentation: I have reviewed the above documentation for accuracy and completeness, and I agree with the above.  Boneta Sharps, MD    "

## 2024-05-22 NOTE — Patient Instructions (Addendum)
 Topical steroids (such as triamcinolone, fluocinolone, fluocinonide, mometasone, clobetasol , halobetasol, betamethasone, hydrocortisone) can cause thinning and lightening of the skin if they are used for too long in the same area. Your physician has selected the right strength medicine for your problem and area affected on the body. Please use your medication only as directed by your physician to prevent side effects.       Due to recent changes in healthcare laws, you may see results of your pathology and/or laboratory studies on MyChart before the doctors have had a chance to review them. We understand that in some cases there may be results that are confusing or concerning to you. Please understand that not all results are received at the same time and often the doctors may need to interpret multiple results in order to provide you with the best plan of care or course of treatment. Therefore, we ask that you please give us  2 business days to thoroughly review all your results before contacting the office for clarification. Should we see a critical lab result, you will be contacted sooner.   If You Need Anything After Your Visit  If you have any questions or concerns for your doctor, please call our main line at [phone] and press option 4 to reach your doctor's medical assistant. If no one answers, please leave a voicemail as directed and we will return your call as soon as possible. Messages left after 4 pm will be answered the following business day.   You may also send us  a message via MyChart. We typically respond to MyChart messages within 1-2 business days.  For prescription refills, please ask your pharmacy to contact our office. Our fax number is [phone].  If you have an urgent issue when the clinic is closed that cannot wait until the next business day, you can page your doctor at the number below.    Please note that while we do our best to be available for urgent issues  outside of office hours, we are not available 24/7.   If you have an urgent issue and are unable to reach us , you may choose to seek medical care at your doctor's office, retail clinic, urgent care center, or emergency room.  If you have a medical emergency, please immediately call 911 or go to the emergency department.  Pager Numbers  - Dr. Hester: [phone]  - Dr. Jackquline: [phone]  - Dr. Claudene: [phone]   - Dr. Raymund: [phone]  In the event of inclement weather, please call our main line at [phone] for an update on the status of any delays or closures.  Dermatology Medication Tips: Please keep the boxes that topical medications come in in order to help keep track of the instructions about where and how to use these. Pharmacies typically print the medication instructions only on the boxes and not directly on the medication tubes.   If your medication is too expensive, please contact our office at [phone] option 4 or send us  a message through MyChart.   We are unable to tell what your co-pay for medications will be in advance as this is different depending on your insurance coverage. However, we may be able to find a substitute medication at lower cost or fill out paperwork to get insurance to cover a needed medication.   If a prior authorization is required to get your medication covered by your insurance company, please allow us  1-2 business days to complete this process.  Drug prices often vary depending  on where the prescription is filled and some pharmacies may offer cheaper prices.  The website www.goodrx.com contains coupons for medications through different pharmacies. The prices here do not account for what the cost may be with help from insurance (it may be cheaper with your insurance), but the website can give you the price if you did not use any insurance.  - You can print the associated coupon and take it with your prescription to the pharmacy.   - You may also stop by our office during regular business hours and pick up a GoodRx coupon card.  - If you need your prescription sent electronically to a different pharmacy, notify our office through [facility] or by phone at [phone] option 4.     Si Usted Necesita Algo Despus de Su Visita  Tambin puede enviarnos un mensaje a travs de Clinical cytogeneticist. Por lo general respondemos a los mensajes de MyChart en el transcurso de 1 a 2 das hbiles.  Para renovar recetas, por favor pida a su farmacia que se ponga en contacto con nuestra oficina. Randi lakes de fax es [place] [phone].  Si tiene un asunto urgente cuando la clnica est cerrada y que no puede esperar hasta el siguiente da hbil, puede llamar/localizar a su doctor(a) al nmero que aparece a continuacin.   Por favor, tenga en cuenta que aunque hacemos todo lo posible para estar disponibles para asuntos urgentes fuera del horario de [place], no estamos disponibles las 24 horas del da, los 7 [address]  [address] de la [place].   Si tiene un problema urgente y no puede comunicarse con nosotros, puede optar por buscar atencin mdica  en el consultorio de su doctor(a), en una clnica privada, en un centro de atencin urgente o en una sala de emergencias.  Si tiene Engineer, drilling, por favor llame inmediatamente al 911 o vaya a la sala de emergencias.  Nmeros de bper  - Dr. Hester: [phone]  - Dra. Jackquline: [phone]  - Dr. Claudene: [phone]  - Dra. Kitts: [phone]  En caso de inclemencias del [place], por favor llame a nuestra lnea principal al [phone] para una actualizacin sobre el estado de cualquier retraso o cierre.  Consejos para la medicacin en dermatologa: Por favor, guarde las cajas en las que vienen los medicamentos de uso tpico para ayudarle a seguir las instrucciones sobre dnde y cmo usarlos. Las farmacias generalmente imprimen las instrucciones del medicamento slo en las cajas y no  directamente en los tubos del [place].   Si su medicamento es muy caro, por favor, pngase en contacto con landry rieger llamando al [phone] y presione la opcin 4 o envenos un mensaje a travs de Clinical cytogeneticist.   No podemos decirle cul ser su copago por los medicamentos por adelantado ya que esto es diferente dependiendo de la cobertura de su seguro. Sin embargo, es posible que podamos encontrar un medicamento sustituto a Audiological scientist un formulario para que el seguro cubra el medicamento que se considera necesario.   Si se requiere una autorizacin previa para que su compaa de seguros malta su medicamento, por favor permtanos de 1 a 2 das hbiles para completar este proceso.  Los precios de los medicamentos varan con frecuencia dependiendo del Environmental consultant de dnde se surte la receta y alguna farmacias pueden ofrecer precios ms baratos.  El sitio web www.goodrx.com tiene cupones para medicamentos de Health and safety inspector. Los precios aqu no tienen en cuenta lo que podra costar con la ayuda del seguro (puede ser ms  barato con su seguro), pero el sitio web puede darle el precio si no Visual merchandiser.  - Puede imprimir el cupn correspondiente y llevarlo con su receta a la farmacia.  - Tambin puede pasar por nuestra oficina durante el horario de atencin regular y Education officer, museum una tarjeta de cupones de GoodRx.  - Si necesita que su receta se enve electrnicamente a una farmacia diferente, informe a nuestra oficina a travs de MyChart de [place] o por telfono llamando al [phone] y presione la opcin 4.
